# Patient Record
Sex: Female | Born: 1965 | Race: Black or African American | Hispanic: No | Marital: Single | Smoking: Never smoker
Health system: Southern US, Community
[De-identification: ages and names within clinical notes are randomized; demographics above are authoritative.]

## PROBLEM LIST (undated history)

## (undated) DIAGNOSIS — F329 Major depressive disorder, single episode, unspecified: Secondary | ICD-10-CM

## (undated) DIAGNOSIS — F32A Depression, unspecified: Secondary | ICD-10-CM

## (undated) DIAGNOSIS — I10 Essential (primary) hypertension: Secondary | ICD-10-CM

## (undated) DIAGNOSIS — I219 Acute myocardial infarction, unspecified: Secondary | ICD-10-CM

## (undated) DIAGNOSIS — G4730 Sleep apnea, unspecified: Secondary | ICD-10-CM

## (undated) DIAGNOSIS — K219 Gastro-esophageal reflux disease without esophagitis: Secondary | ICD-10-CM

## (undated) HISTORY — PX: CHOLECYSTECTOMY: SHX55

---

## 2012-06-09 ENCOUNTER — Emergency Department (HOSPITAL_COMMUNITY): Payer: Self-pay

## 2012-06-09 ENCOUNTER — Encounter (HOSPITAL_COMMUNITY): Payer: Self-pay | Admitting: Emergency Medicine

## 2012-06-09 ENCOUNTER — Observation Stay (HOSPITAL_COMMUNITY)
Admission: EM | Admit: 2012-06-09 | Discharge: 2012-06-10 | Disposition: A | Discharge status: Home / Self Care | Payer: Self-pay | Attending: Emergency Medicine | Admitting: Emergency Medicine

## 2012-06-09 ENCOUNTER — Other Ambulatory Visit: Payer: Self-pay

## 2012-06-09 DIAGNOSIS — I10 Essential (primary) hypertension: Secondary | ICD-10-CM | POA: Insufficient documentation

## 2012-06-09 DIAGNOSIS — R079 Chest pain, unspecified: Principal | ICD-10-CM | POA: Insufficient documentation

## 2012-06-09 DIAGNOSIS — K219 Gastro-esophageal reflux disease without esophagitis: Secondary | ICD-10-CM | POA: Insufficient documentation

## 2012-06-09 DIAGNOSIS — R1310 Dysphagia, unspecified: Secondary | ICD-10-CM | POA: Insufficient documentation

## 2012-06-09 DIAGNOSIS — R0602 Shortness of breath: Secondary | ICD-10-CM | POA: Insufficient documentation

## 2012-06-09 DIAGNOSIS — K047 Periapical abscess without sinus: Secondary | ICD-10-CM | POA: Insufficient documentation

## 2012-06-09 DIAGNOSIS — Z792 Long term (current) use of antibiotics: Secondary | ICD-10-CM | POA: Insufficient documentation

## 2012-06-09 DIAGNOSIS — R110 Nausea: Secondary | ICD-10-CM | POA: Insufficient documentation

## 2012-06-09 DIAGNOSIS — M7989 Other specified soft tissue disorders: Secondary | ICD-10-CM | POA: Insufficient documentation

## 2012-06-09 DIAGNOSIS — R42 Dizziness and giddiness: Secondary | ICD-10-CM | POA: Insufficient documentation

## 2012-06-09 DIAGNOSIS — M542 Cervicalgia: Secondary | ICD-10-CM | POA: Insufficient documentation

## 2012-06-09 HISTORY — DX: Essential (primary) hypertension: I10

## 2012-06-09 HISTORY — DX: Gastro-esophageal reflux disease without esophagitis: K21.9

## 2012-06-09 LAB — URINALYSIS, ROUTINE W REFLEX MICROSCOPIC
Bilirubin Urine: NEGATIVE
Glucose, UA: NEGATIVE mg/dL
Hgb urine dipstick: NEGATIVE
Ketones, ur: NEGATIVE mg/dL
Nitrite: NEGATIVE
Protein, ur: 30 mg/dL — AB
Specific Gravity, Urine: 1.023 (ref 1.005–1.030)
Urobilinogen, UA: 0.2 mg/dL (ref 0.0–1.0)
pH: 6 (ref 5.0–8.0)

## 2012-06-09 LAB — COMPREHENSIVE METABOLIC PANEL WITH GFR
ALT: 14 U/L (ref 0–35)
Albumin: 4.2 g/dL (ref 3.5–5.2)
Alkaline Phosphatase: 93 U/L (ref 39–117)
BUN: 10 mg/dL (ref 6–23)
Chloride: 105 meq/L (ref 96–112)
Potassium: 3.6 meq/L (ref 3.5–5.1)
Sodium: 140 meq/L (ref 135–145)
Total Bilirubin: 0.3 mg/dL (ref 0.3–1.2)
Total Protein: 7.7 g/dL (ref 6.0–8.3)

## 2012-06-09 LAB — COMPREHENSIVE METABOLIC PANEL
AST: 18 U/L (ref 0–37)
CO2: 25 mEq/L (ref 19–32)
Calcium: 10 mg/dL (ref 8.4–10.5)
Creatinine, Ser: 0.75 mg/dL (ref 0.50–1.10)
GFR calc Af Amer: >90 mL/min (ref >90)
GFR calc non Af Amer: >90 mL/min (ref >90)
Glucose, Bld: 97 mg/dL (ref 70–99)

## 2012-06-09 LAB — CBC WITH DIFFERENTIAL/PLATELET
Basophils Absolute: 0 10*3/uL (ref 0.0–0.1)
Basophils Relative: 1 % (ref 0–1)
Eosinophils Absolute: 0.1 K/uL (ref 0.0–0.7)
Eosinophils Relative: 1 % (ref 0–5)
HCT: 39 % (ref 36.0–46.0)
Hemoglobin: 12.8 g/dL (ref 12.0–15.0)
Lymphocytes Relative: 35 % (ref 12–46)
Lymphs Abs: 2.6 K/uL (ref 0.7–4.0)
MCH: 27.2 pg (ref 26.0–34.0)
MCHC: 32.8 g/dL (ref 30.0–36.0)
MCV: 83 fL (ref 78.0–100.0)
Monocytes Absolute: 0.5 10*3/uL (ref 0.1–1.0)
Monocytes Relative: 7 % (ref 3–12)
Neutro Abs: 4.2 K/uL (ref 1.7–7.7)
Neutrophils Relative %: 57 % (ref 43–77)
Platelets: 280 K/uL (ref 150–400)
RBC: 4.7 MIL/uL (ref 3.87–5.11)
RDW: 12.7 % (ref 11.5–15.5)
WBC: 7.4 10*3/uL (ref 4.0–10.5)

## 2012-06-09 LAB — POCT I-STAT TROPONIN I
Troponin i, poc: 0 ng/mL (ref 0.00–0.08)
Troponin i, poc: 0 ng/mL (ref 0.00–0.08)

## 2012-06-09 LAB — TROPONIN I: Troponin I: <0.3 ng/mL (ref <0.30)

## 2012-06-09 LAB — URINE MICROSCOPIC-ADD ON

## 2012-06-09 MED ORDER — ZOLPIDEM TARTRATE 5 MG PO TABS
5.0000 mg | ORAL_TABLET | Freq: Every evening | ORAL | Status: DC | PRN
  Administered 2012-06-09: 5 mg via ORAL
  Filled 2012-06-09: qty 1

## 2012-06-09 MED ORDER — IOHEXOL 350 MG/ML SOLN
100.0000 mL | Freq: Once | INTRAVENOUS | Status: AC | PRN
  Administered 2012-06-09: 100 mL via INTRAVENOUS

## 2012-06-09 MED ORDER — ASPIRIN 81 MG PO CHEW
324.0000 mg | CHEWABLE_TABLET | Freq: Once | ORAL | Status: AC
  Administered 2012-06-09: 324 mg via ORAL
  Filled 2012-06-09: qty 4

## 2012-06-09 MED ORDER — MORPHINE SULFATE 10 MG/ML IJ SOLN
2.0000 mg | INTRAMUSCULAR | Status: DC | PRN
  Administered 2012-06-09: 2 mg via INTRAVENOUS
  Filled 2012-06-09: qty 1

## 2012-06-09 MED ORDER — KETOROLAC TROMETHAMINE 30 MG/ML IJ SOLN
30.0000 mg | Freq: Once | INTRAMUSCULAR | Status: AC
  Administered 2012-06-09: 30 mg via INTRAVENOUS
  Filled 2012-06-09: qty 1

## 2012-06-09 MED ORDER — GI COCKTAIL ~~LOC~~: 30.0000 mL | Freq: Four times a day (QID) | ORAL | Status: DC | PRN

## 2012-06-09 MED ORDER — ONDANSETRON HCL 4 MG/2ML IJ SOLN
4.0000 mg | Freq: Four times a day (QID) | INTRAMUSCULAR | Status: DC | PRN
Start: 2012-06-09 — End: 2012-06-10

## 2012-06-09 MED ORDER — PENICILLIN V POTASSIUM 250 MG PO TABS
500.0000 mg | ORAL_TABLET | Freq: Once | ORAL | Status: AC
  Administered 2012-06-09: 500 mg via ORAL
  Filled 2012-06-09: qty 2

## 2012-06-09 MED ORDER — ACETAMINOPHEN 325 MG PO TABS: 650.0000 mg | ORAL_TABLET | ORAL | Status: DC | PRN

## 2012-06-09 NOTE — ED Notes (Signed)
States pain in her throat (points to jugular notch area) that radiates thru to her back between her shoulders. States feels like "you want someone to stand on your back", denies belching. States takes a gas pill every day because of a lot of gas

## 2012-06-09 NOTE — ED Notes (Signed)
Cp x 2 weeks just moved from [place] x 1 month some n/v/sob she states

## 2012-06-09 NOTE — ED Notes (Signed)
Pain in back and whole left side pain comes and goes

## 2012-06-09 NOTE — ED Notes (Signed)
Pt reports she has pain that starts in her left neck that radiates down to the top of her chest then into her back for 2 weeks.

## 2012-06-09 NOTE — ED Notes (Signed)
Offered a blanket  Did not want one

## 2012-06-09 NOTE — ED Provider Notes (Signed)
History     CSN: [id number]  Arrival date & time 06/09/12  1332   First MD Initiated Contact with Patient 06/09/12 1654      Chief Complaint  Patient presents with  . Chest Pain    (Consider location/radiation/quality/duration/timing/severity/associated sxs/prior treatment) Patient is a 46 y.o. Hill presenting with chest pain. The history is provided by the patient.  Chest Pain The chest pain began 1 - 2 weeks ago. Chest pain occurs intermittently. The chest pain is unchanged. The pain is associated with exertion. At its most intense, the pain is at 8/10. The pain is currently at 4/10. The quality of the pain is described as pressure-like. The pain radiates to the left neck and left shoulder. Chest pain is worsened by exertion. Primary symptoms include shortness of breath, nausea and dizziness. Pertinent negatives for primary symptoms include no fever, no fatigue, no cough, no palpitations, no abdominal pain and no vomiting.  Dizziness also occurs with nausea. Dizziness does not occur with vomiting or weakness.  Pertinent negatives for associated symptoms include no weakness.   Pt reports left sided chest pain on and off for several weeks, associated with SOB, dizziness, nausea. States takes tylenol, aspirin for it, which makes it better. Sometimes rest makes it better as well.  States pain worsened with walking, exertion, cold air. Denies cough, URI symptoms. Admits to LE swelling, elevated BP. Pt is not on any medications. Just moved up here from [place], about a month ago, no PCP here. Pt also states she feels like there is a "knot" in her left throat, feels it when swallowing. No pain. No prior cardiac work up. No family hx of MIs.   Past Medical History  Diagnosis Date  . Acid reflux   . Hypertension     Past Surgical History  Procedure Date  . Cholecystectomy     No family history on file.  History  Substance Use Topics  . Smoking status: Never Smoker   . Smokeless tobacco:  Not on file  . Alcohol Use: No    OB History    Grav Para Term Preterm Abortions TAB SAB Ect Mult Living                  Review of Systems  Constitutional: Negative for fever, chills and fatigue.  HENT: Negative for neck pain and neck stiffness.   Eyes: Negative for pain and visual disturbance.  Respiratory: Positive for chest tightness and shortness of breath. Negative for cough.   Cardiovascular: Positive for chest pain and leg swelling. Negative for palpitations.  Gastrointestinal: Positive for nausea. Negative for vomiting and abdominal pain.  Genitourinary: Negative for dysuria.  Musculoskeletal: Negative.   Skin: Negative.   Neurological: Positive for dizziness and light-headedness. Negative for weakness and headaches.    Allergies  Review of patient's allergies indicates no known allergies.  Home Medications   Current Outpatient Rx  Name Route Sig Dispense Refill  . TENSION HEADACHE PO Oral Take 1 tablet by mouth daily. Tension headache relief  Acetaminophen 500mg  caffeine 65mg     . AMOXICILLIN 500 MG PO CAPS Oral Take 500 mg by mouth daily as needed. For tooth pain and drainage    . ASPIRIN EC 81 MG PO TBEC Oral Take 81 mg by mouth daily.    Marland Kitchen DIPHENHYDRAMINE-APAP (SLEEP) 25-500 MG PO TABS Oral Take 2 tablets by mouth at bedtime.    Marland Kitchen LOPERAMIDE HCL 2 MG PO TABS Oral Take 2 mg by mouth daily. Anti-diarrheal    .  OVER THE COUNTER MEDICATION Oral Take 1 tablet by mouth daily. Acetaminophen 500mg  and caffeine 32.5mg       BP 162/97  Pulse 60  Temp 98.5 F (36.9 C) (Oral)  Resp 20  SpO2 100%  Physical Exam  Nursing note and vitals reviewed. Constitutional: She is oriented to person, place, and time. She appears well-developed and well-nourished. No distress.       obese  HENT:  Head: Normocephalic and atraumatic.  Right Ear: External ear normal.  Left Ear: External ear normal.  Nose: Nose normal.  Mouth/Throat: Oropharynx is clear and moist.       Abscess to  the right lower 2nd bicuspid, tender to palpation  Eyes: Conjunctivae are normal. Pupils are equal, round, and reactive to light.  Neck: Normal range of motion. Neck supple.       i could not feel a mass, howevet pt feels like there is a swelling and points to the left side of the thyroid  Cardiovascular: Normal rate, regular rhythm and normal heart sounds.   Pulmonary/Chest: Effort normal and breath sounds normal. No respiratory distress. She has no wheezes. She has no rales.  Abdominal: Soft. Bowel sounds are normal. She exhibits no distension. There is no tenderness.  Musculoskeletal:       Trace LE pitting edema bilaterally  Lymphadenopathy:    She has no cervical adenopathy.  Neurological: She is alert and oriented to person, place, and time.  Skin: Skin is warm and dry.  Psychiatric: She has a normal mood and affect.    ED Course  Procedures (including critical care time)  Pt with chest tightness, no prior work up. Hypertensive. Will get labs, ecg, cxr.   Results for orders placed during the hospital encounter of 06/09/12  TROPONIN I      Component Value Range   Troponin I <0.30  <0.30 ng/mL  CBC WITH DIFFERENTIAL      Component Value Range   WBC 7.4  4.0 - 10.5 K/uL   RBC 4.Jasmine  3.87 - 5.11 MIL/uL   Hemoglobin 12.8  12.0 - 15.0 g/dL   HCT 40.9  81.1 - 91.4 %   MCV 83.0  78.0 - 100.0 fL   MCH 27.2  26.0 - 34.0 pg   MCHC 32.8  30.0 - 36.0 g/dL   RDW 78.2  95.6 - 21.3 %   Platelets 280  150 - 400 K/uL   Neutrophils Relative 57  43 - 77 %   Neutro Abs 4.2  1.7 - 7.7 K/uL   Lymphocytes Relative 35  12 - 46 %   Lymphs Abs 2.6  0.7 - 4.0 K/uL   Monocytes Relative 7  3 - 12 %   Monocytes Absolute 0.5  0.1 - 1.0 K/uL   Eosinophils Relative 1  0 - 5 %   Eosinophils Absolute 0.1  0.0 - 0.7 K/uL   Basophils Relative 1  0 - 1 %   Basophils Absolute 0.0  0.0 - 0.1 K/uL  COMPREHENSIVE METABOLIC PANEL      Component Value Range   Sodium 140  135 - 145 mEq/L   Potassium 3.6  3.5  - 5.1 mEq/L   Chloride 105  96 - 112 mEq/L   CO2 25  19 - 32 mEq/L   Glucose, Bld 97  Jasmine - 99 mg/dL   BUN 10  6 - 23 mg/dL   Creatinine, Ser 0.86  0.50 - 1.10 mg/dL   Calcium 57.8  8.4 - 46.9  mg/dL   Total Protein 7.7  6.0 - 8.3 g/dL   Albumin 4.2  3.5 - 5.2 g/dL   AST 18  0 - 37 U/L   ALT 14  0 - 35 U/L   Alkaline Phosphatase 93  39 - 117 U/L   Total Bilirubin 0.3  0.3 - 1.2 mg/dL   GFR calc non Af Amer >90  >90 mL/min   GFR calc Af Amer >90  >90 mL/min  URINALYSIS, ROUTINE W REFLEX MICROSCOPIC      Component Value Range   Color, Urine YELLOW  YELLOW   APPearance CLOUDY (*) CLEAR   Specific Gravity, Urine 1.023  1.005 - 1.030   pH 6.0  5.0 - 8.0   Glucose, UA NEGATIVE  NEGATIVE mg/dL   Hgb urine dipstick NEGATIVE  NEGATIVE   Bilirubin Urine NEGATIVE  NEGATIVE   Ketones, ur NEGATIVE  NEGATIVE mg/dL   Protein, ur 30 (*) NEGATIVE mg/dL   Urobilinogen, UA 0.2  0.0 - 1.0 mg/dL   Nitrite NEGATIVE  NEGATIVE   Leukocytes, UA SMALL (*) NEGATIVE  URINE MICROSCOPIC-ADD ON      Component Value Range   Squamous Epithelial / LPF MANY (*) RARE   WBC, UA 3-6  <3 WBC/hpf   Bacteria, UA RARE  RARE   Casts HYALINE CASTS (*) NEGATIVE   Crystals CA OXALATE CRYSTALS (*) NEGATIVE   Urine-Other MUCOUS PRESENT    POCT I-STAT TROPONIN I      Component Value Range   Troponin i, poc 0.00  0.00 - 0.08 ng/mL   Comment 3           POCT I-STAT TROPONIN I      Component Value Range   Troponin i, poc 0.00  0.00 - 0.08 ng/mL   Comment 3            Dg Chest 2 View  06/09/2012  *RADIOLOGY REPORT*  Clinical Data: Chest pain, difficulty swallowing  CHEST - 2 VIEW  Comparison: None.  Findings: Cardiomediastinal silhouette is unremarkable.  No acute infiltrate or pleural effusion.  No pulmonary edema.  Mild degenerative changes thoracic spine.  IMPRESSION: No active disease.  Mild degenerative changes thoracic spine.  Original Report Authenticated By: Natasha Mead, M.D.   Ct Soft Tissue Neck W  Contrast  06/09/2012  *RADIOLOGY REPORT*  Clinical Data: Neck pain., chest pain, possible left neck mass.  CT NECK WITH CONTRAST  Technique:  Multidetector CT imaging of the neck was performed with intravenous contrast.  Contrast: OMNIPAQUE IOHEXOL 350 MG/ML SOLN  Comparison: None.  Findings: Suprahyoid neck:  Negative.  Larynx:  Negative.  Infrahyoid neck:  Negative.  Lymph nodes:  No pathologic lymphadenopathy.  Upper chest/mediastinum:  See chest CT report.  Additional:  Abundant subcutaneous adipose tissue.  No mass or inflammatory process.  No osseous findings.  Mild spondylosis.  Craniocervical vasculature patent.  Vessels are not sufficiently opacified however to exclude ICA dissection.  IMPRESSION: Unremarkable CT neck with contrast.  No neck mass or inflammatory process is seen.  Original Report Authenticated By: Elsie Stain, M.D.   Ct Angio Chest W/cm &/or Wo Cm  06/09/2012  *RADIOLOGY REPORT*  Clinical Data: Chest pain for 2 weeks.  CT ANGIOGRAPHY CHEST  Technique:  Multidetector CT imaging of the chest using the standard protocol during bolus administration of intravenous contrast. Multiplanar reconstructed images including MIPs were obtained and reviewed to evaluate the vascular anatomy.  Contrast: OMNIPAQUE IOHEXOL 350 MG/ML SOLN  Comparison:  None.  Findings: Cardiomegaly is present.  No coronary artery calcification is seen.  There is no central pulmonary embolus. Detection of peripheral PE hampered somewhat by body habitus.  No pulmonary infiltrates.  No hilar or mediastinal adenopathy. Evidence for prior granulomatous disease.  No pleural or pericardial effusion.  No lung nodules.  Degenerative changes are present in the thoracic spine with multilevel anterior spurring. Hepatic steatosis.  Prior cholecystectomy.  IMPRESSION: Cardiomegaly.  No central pulmonary embolus.  No infiltrate or failure.  Original Report Authenticated By: Elsie Stain, M.D.     Date: 06/10/2012   Rate: Jasmine  Rhythm: normal sinus rhythm  QRS Axis: normal  Intervals: normal  ST/T Wave abnormalities: normal  Conduction Disutrbances: none  Narrative Interpretation:   Old EKG Reviewed:No old   Pt complaining of mass like sensation to left neck, CT negative. CT angio negative. Enzymes negative. Pt chest pain free at present. Will place on CP protocol with stress in am. Pt also has a dental abscess. First dose of penicillin given tonight. Will need antibiotics upon discharge.     No diagnosis found.    MDM          Lottie Mussel, PA 06/10/12 [phone]

## 2012-06-10 DIAGNOSIS — R072 Precordial pain: Secondary | ICD-10-CM

## 2012-06-10 LAB — POCT I-STAT TROPONIN I: Troponin i, poc: 0 ng/mL (ref 0.00–0.08)

## 2012-06-10 MED ORDER — OMEPRAZOLE 20 MG PO CPDR: 20.0000 mg | DELAYED_RELEASE_CAPSULE | Freq: Every day | ORAL | Status: DC

## 2012-06-10 MED ORDER — PENICILLIN V POTASSIUM 500 MG PO TABS: 500.0000 mg | ORAL_TABLET | Freq: Three times a day (TID) | ORAL | Status: AC

## 2012-06-10 NOTE — Progress Notes (Signed)
Observation review is complete. 

## 2012-06-10 NOTE — ED Notes (Signed)
Called vascular lab to check on delay in results. Per billy dr Jens Som is aware of study

## 2012-06-10 NOTE — ED Provider Notes (Signed)
Medical screening examination/treatment/procedure(s) were performed by non-physician practitioner and as supervising physician I was immediately available for consultation/collaboration.   Russie Gulledge M Jarelle Ates, MD 06/10/12 1708 

## 2012-06-10 NOTE — ED Provider Notes (Signed)
Jasmine Hill is a 46 y.o. female who has been in the CDU overnight on the chest pain protocol.  Her labs, CTA and Echo Stress have all resulted as normal and the chest pain work up remains negative.    She was hypertensive on arrival at 183/108 but had a spontaneous decrease to 158/91.  She reports a history of HTN without treatment.  She does not have a PCP here in [place]. Her CP symptoms are reported to be worse in the early morning hours and when lying flat therefore I will attempt to treat as reflux symptoms by starting Prilosec.  We will discharge today with a planned follow up with a primary care provider to address the chest pain and HTN.  I will not start oral antihypertensives today as her BP has decreased without intervention.  She has been provided resources for obtaining this appointment.  She is also c/o a dental abscess in the R low aspect between the canine and 1st molar as denoted below.  She has been self treating with an old Amoxicillin Rx.  I will treat this with a PCN Rx and have her follow up with dentistry.    Periodontal Exam      Dierdre Forth, PA-C 06/10/12 1247

## 2012-06-10 NOTE — Progress Notes (Signed)
  Echocardiogram Echocardiogram Stress Test has been performed.  Jasmine Hill 06/10/2012, 9:23 AM

## 2012-06-13 NOTE — ED Provider Notes (Signed)
Medical screening examination/treatment/procedure(s) were conducted as a shared visit with non-physician practitioner(s) and myself.  I personally evaluated the patient during the encounter  Pt with risk factors for CAD, obesity, HTN.  CT shows no PE, PTX.  Would recommend CDU obs and stress test.  No active CP at the moment, RA sats are normal.  Troponin is neg initially.    Gavin Pound. Dary Dilauro, MD 06/13/12 1191

## 2015-11-26 ENCOUNTER — Emergency Department (INDEPENDENT_AMBULATORY_CARE_PROVIDER_SITE_OTHER)
Admission: EM | Admit: 2015-11-26 | Discharge: 2015-11-26 | Disposition: A | Discharge status: Home / Self Care | Payer: BLUE CROSS/BLUE SHIELD | Source: Home / Self Care | Attending: Emergency Medicine | Admitting: Emergency Medicine

## 2015-11-26 ENCOUNTER — Encounter (HOSPITAL_COMMUNITY): Payer: Self-pay | Admitting: Emergency Medicine

## 2015-11-26 DIAGNOSIS — K029 Dental caries, unspecified: Secondary | ICD-10-CM | POA: Diagnosis not present

## 2015-11-26 DIAGNOSIS — I159 Secondary hypertension, unspecified: Secondary | ICD-10-CM | POA: Diagnosis not present

## 2015-11-26 MED ORDER — HYDROCODONE-ACETAMINOPHEN 5-325 MG PO TABS: 1.0000 | ORAL_TABLET | ORAL | Status: DC | PRN

## 2015-11-26 MED ORDER — AMOXICILLIN 500 MG PO CAPS: 500.0000 mg | ORAL_CAPSULE | Freq: Three times a day (TID) | ORAL | Status: DC

## 2015-11-26 MED ORDER — AMLODIPINE BESYLATE 10 MG PO TABS: 10.0000 mg | ORAL_TABLET | Freq: Every day | ORAL | Status: DC

## 2015-11-26 NOTE — ED Notes (Signed)
Pt c/o HTN associated w/HA onset 2 weeks... Believes it's due to dental pain on lower right side Has appt w/dentist on 12/09/15 A&O x4... No acute distress.

## 2015-11-26 NOTE — Discharge Instructions (Signed)
Dental Caries Dental caries is tooth decay. This decay can cause a hole in teeth (cavity) that can get bigger and deeper over time. HOME CARE  Brush and floss your teeth. Do this at least two times a day.  Use a fluoride toothpaste.  Use a mouth rinse if told by your dentist or doctor.  Eat less sugary and starchy foods. Drink less sugary drinks.  Avoid snacking often on sugary and starchy foods. Avoid sipping often on sugary drinks.  Keep regular checkups and cleanings with your dentist.  Use fluoride supplements if told by your dentist or doctor.  Allow fluoride to be applied to teeth if told by your dentist or doctor.   This information is not intended to replace advice given to you by your health care provider. Make sure you discuss any questions you have with your health care provider.   Document Released: 08/11/2008 Document Revised: 11/23/2014 Document Reviewed: 11/04/2012 Elsevier Interactive Patient Education 2016 [employer] Hypertension Hypertension, commonly called high blood pressure, is when the force of blood pumping through your arteries is too strong. Your arteries are the blood vessels that carry blood from your heart throughout your body. A blood pressure reading consists of a higher number over a lower number, such as 110/72. The higher number (systolic) is the pressure inside your arteries when your heart pumps. The lower number (diastolic) is the pressure inside your arteries when your heart relaxes. Ideally you want your blood pressure below 120/80. Hypertension forces your heart to work harder to pump blood. Your arteries may become narrow or stiff. Having untreated or uncontrolled hypertension can cause heart attack, stroke, kidney disease, and other problems. RISK FACTORS Some risk factors for high blood pressure are controllable. Others are not.  Risk factors you cannot control include:   Race. You may be at higher risk if you are African American.  Age.  Risk increases with age.  Gender. Men are at higher risk than women before age 52 years. After age 52, women are at higher risk than men. Risk factors you can control include:  Not getting enough exercise or physical activity.  Being overweight.  Getting too much fat, sugar, calories, or salt in your diet.  Drinking too much alcohol. SIGNS AND SYMPTOMS Hypertension does not usually cause signs or symptoms. Extremely high blood pressure (hypertensive crisis) may cause headache, anxiety, shortness of breath, and nosebleed. DIAGNOSIS To check if you have hypertension, your health care provider will measure your blood pressure while you are seated, with your arm held at the level of your heart. It should be measured at least twice using the same arm. Certain conditions can cause a difference in blood pressure between your right and left arms. A blood pressure reading that is higher than normal on one occasion does not mean that you need treatment. If it is not clear whether you have high blood pressure, you may be asked to return on a different day to have your blood pressure checked again. Or, you may be asked to monitor your blood pressure at home for 1 or more weeks. TREATMENT Treating high blood pressure includes making lifestyle changes and possibly taking medicine. Living a healthy lifestyle can help lower high blood pressure. You may need to change some of your habits. Lifestyle changes may include:  Following the DASH diet. This diet is high in fruits, vegetables, and whole grains. It is low in salt, red meat, and added sugars.  Keep your sodium intake below 2,300 mg  per day.  Getting at least 30-45 minutes of aerobic exercise at least 4 times per week.  Losing weight if necessary.  Not smoking.  Limiting alcoholic beverages.  Learning ways to reduce stress. Your health care provider may prescribe medicine if lifestyle changes are not enough to get your blood pressure under  control, and if one of the following is true:  You are 107-90 years of age and your systolic blood pressure is above 140.  You are 55 years of age or older, and your systolic blood pressure is above 150.  Your diastolic blood pressure is above 90.  You have diabetes, and your systolic blood pressure is over 140 or your diastolic blood pressure is over 90.  You have kidney disease and your blood pressure is above 140/90.  You have heart disease and your blood pressure is above 140/90. Your personal target blood pressure may vary depending on your medical conditions, your age, and other factors. HOME CARE INSTRUCTIONS  Have your blood pressure rechecked as directed by your health care provider.   Take medicines only as directed by your health care provider. Follow the directions carefully. Blood pressure medicines must be taken as prescribed. The medicine does not work as well when you skip doses. Skipping doses also puts you at risk for problems.  Do not smoke.   Monitor your blood pressure at home as directed by your health care provider. SEEK MEDICAL CARE IF:   You think you are having a reaction to medicines taken.  You have recurrent headaches or feel dizzy.  You have swelling in your ankles.  You have trouble with your vision. SEEK IMMEDIATE MEDICAL CARE IF:  You develop a severe headache or confusion.  You have unusual weakness, numbness, or feel faint.  You have severe chest or abdominal pain.  You vomit repeatedly.  You have trouble breathing. MAKE SURE YOU:   Understand these instructions.  Will watch your condition.  Will get help right away if you are not doing well or get worse.   This information is not intended to replace advice given to you by your health care provider. Make sure you discuss any questions you have with your health care provider.   Document Released: 11/02/2005 Document Revised: 03/19/2015 Document Reviewed: 08/25/2013 Elsevier  Interactive Patient Education [employer]

## 2015-11-26 NOTE — ED Provider Notes (Signed)
CSN: [id number]     Arrival date & time 11/26/15  1835 History   First MD Initiated Contact with Patient 11/26/15 1958     Chief Complaint  Patient presents with  . Hypertension  . Dental Pain   (Consider location/radiation/quality/duration/timing/severity/associated sxs/prior Treatment) HPI History obtained from patient:   LOCATION: right lower tooth SEVERITY:6 DURATION:1 week CONTEXT:sudden onset of pain, decayed tooth for quite some time QUALITY:ache MODIFYING FACTORS: has tried OTC meds, was able to make dental appointment ASSOCIATED SYMPTOMS:jaw pain, not sleeping TIMING:constant Has not taken her medication for about 6 months.   Past Medical History  Diagnosis Date  . Acid reflux   . Hypertension    Past Surgical History  Procedure Laterality Date  . Cholecystectomy     No family history on file. Social History  Substance Use Topics  . Smoking status: Never Smoker   . Smokeless tobacco: None  . Alcohol Use: No   OB History    No data available     Review of Systems ROS +'ve dental pain  Denies: HEADACHE, NAUSEA, ABDOMINAL PAIN, CHEST PAIN, CONGESTION, DYSURIA, SHORTNESS OF BREATH  Allergies  Review of patient's allergies indicates no known allergies.  Home Medications   Prior to Admission medications   Medication Sig Start Date End Date Taking? Authorizing Provider  Acetaminophen-Caffeine (TENSION HEADACHE PO) Take 1 tablet by mouth daily. Tension headache relief  Acetaminophen 500mg  caffeine 65mg     Historical Provider, MD  amLODipine (NORVASC) 10 MG tablet Take 1 tablet (10 mg total) by mouth daily. 11/26/15   Tharon AquasFrank C Ruven Corradi, PA  amoxicillin (AMOXIL) 500 MG capsule Take 1 capsule (500 mg total) by mouth 3 (three) times daily. 11/26/15   Tharon AquasFrank C Sheridan Gettel, PA  aspirin EC 81 MG tablet Take 81 mg by mouth daily.    Historical Provider, MD  diphenhydramine-acetaminophen (TYLENOL PM) 25-500 MG TABS Take 2 tablets by mouth at bedtime.    Historical Provider,  MD  HYDROcodone-acetaminophen (NORCO/VICODIN) 5-325 MG tablet Take 1 tablet by mouth every 4 (four) hours as needed. 11/26/15   Tharon AquasFrank C Solomiya Pascale, PA  loperamide (IMODIUM A-D) 2 MG tablet Take 2 mg by mouth daily. Anti-diarrheal    Historical Provider, MD  omeprazole (PRILOSEC) 20 MG capsule Take 1 capsule (20 mg total) by mouth daily. 06/10/12 06/10/13  Elpidio AnisShari Upstill, PA-C  OVER THE COUNTER MEDICATION Take 1 tablet by mouth daily. Acetaminophen 500mg  and caffeine 32.5mg     Historical Provider, MD   Meds Ordered and Administered this Visit  Medications - No data to display  BP 210/116 mmHg  Pulse 86  Temp(Src) 98.2 F (36.8 C) (Oral)  Resp 18  SpO2 99% No data found.   Physical Exam  Constitutional: She appears well-developed and well-nourished.  HENT:  Head: Normocephalic and atraumatic.  Mouth/Throat:    Pulmonary/Chest: Effort normal.  Nursing note and vitals reviewed.   ED Course  Procedures (including critical care time)  Labs Review Labs Reviewed - No data to display  Imaging Review No results found.   Visual Acuity Review  Right Eye Distance:   Left Eye Distance:   Bilateral Distance:    Right Eye Near:   Left Eye Near:    Bilateral Near:         MDM   1. Secondary hypertension, unspecified   2. Dental caries    Elevated blood pressure has been discussed with patient. She has stated she came for dental pain.  She is will to take prescription for  amlodipine, and will take referral number.  Has appointment with dentist next week.     Tharon Aquas, PA 11/26/15 2026

## 2016-05-18 ENCOUNTER — Other Ambulatory Visit: Payer: Self-pay | Admitting: Family

## 2016-05-18 DIAGNOSIS — R51 Headache: Principal | ICD-10-CM

## 2016-05-18 DIAGNOSIS — R519 Headache, unspecified: Secondary | ICD-10-CM

## 2016-05-28 ENCOUNTER — Ambulatory Visit
Admission: RE | Admit: 2016-05-28 | Discharge: 2016-05-28 | Disposition: A | Discharge status: Home / Self Care | Payer: BLUE CROSS/BLUE SHIELD | Source: Ambulatory Visit | Attending: Family | Admitting: Family

## 2016-05-28 DIAGNOSIS — R519 Headache, unspecified: Secondary | ICD-10-CM

## 2016-05-28 DIAGNOSIS — R51 Headache: Principal | ICD-10-CM

## 2016-06-26 ENCOUNTER — Other Ambulatory Visit: Payer: Self-pay

## 2016-06-26 ENCOUNTER — Other Ambulatory Visit (HOSPITAL_COMMUNITY)
Admission: RE | Admit: 2016-06-26 | Discharge: 2016-06-26 | Disposition: A | Discharge status: Home / Self Care | Payer: BLUE CROSS/BLUE SHIELD | Source: Ambulatory Visit | Attending: Family Medicine | Admitting: Family Medicine

## 2016-06-26 DIAGNOSIS — Z01419 Encounter for gynecological examination (general) (routine) without abnormal findings: Secondary | ICD-10-CM | POA: Diagnosis not present

## 2016-07-01 ENCOUNTER — Other Ambulatory Visit: Payer: Self-pay | Admitting: Family

## 2016-07-01 DIAGNOSIS — Z1231 Encounter for screening mammogram for malignant neoplasm of breast: Secondary | ICD-10-CM

## 2016-07-01 LAB — CYTOLOGY - PAP

## 2016-07-14 ENCOUNTER — Other Ambulatory Visit: Payer: Self-pay | Admitting: Family

## 2016-07-14 ENCOUNTER — Ambulatory Visit
Admission: RE | Admit: 2016-07-14 | Discharge: 2016-07-14 | Disposition: A | Discharge status: Home / Self Care | Payer: BLUE CROSS/BLUE SHIELD | Source: Ambulatory Visit | Attending: Family | Admitting: Family

## 2016-07-14 DIAGNOSIS — Z1231 Encounter for screening mammogram for malignant neoplasm of breast: Secondary | ICD-10-CM

## 2016-09-26 ENCOUNTER — Emergency Department (HOSPITAL_COMMUNITY): Payer: BLUE CROSS/BLUE SHIELD

## 2016-09-26 ENCOUNTER — Encounter (HOSPITAL_COMMUNITY): Payer: Self-pay | Admitting: *Deleted

## 2016-09-26 ENCOUNTER — Emergency Department (HOSPITAL_COMMUNITY)
Admission: EM | Admit: 2016-09-26 | Discharge: 2016-09-27 | Disposition: A | Discharge status: Home / Self Care | Payer: BLUE CROSS/BLUE SHIELD | Attending: Emergency Medicine | Admitting: Emergency Medicine

## 2016-09-26 DIAGNOSIS — Y92410 Unspecified street and highway as the place of occurrence of the external cause: Secondary | ICD-10-CM | POA: Insufficient documentation

## 2016-09-26 DIAGNOSIS — M545 Low back pain: Secondary | ICD-10-CM | POA: Insufficient documentation

## 2016-09-26 DIAGNOSIS — I10 Essential (primary) hypertension: Secondary | ICD-10-CM | POA: Diagnosis not present

## 2016-09-26 DIAGNOSIS — M549 Dorsalgia, unspecified: Secondary | ICD-10-CM | POA: Diagnosis present

## 2016-09-26 DIAGNOSIS — Y999 Unspecified external cause status: Secondary | ICD-10-CM | POA: Insufficient documentation

## 2016-09-26 DIAGNOSIS — Y939 Activity, unspecified: Secondary | ICD-10-CM | POA: Insufficient documentation

## 2016-09-26 DIAGNOSIS — Z79899 Other long term (current) drug therapy: Secondary | ICD-10-CM | POA: Insufficient documentation

## 2016-09-26 DIAGNOSIS — S20229A Contusion of unspecified back wall of thorax, initial encounter: Secondary | ICD-10-CM

## 2016-09-26 MED ORDER — NAPROXEN 250 MG PO TABS
500.0000 mg | ORAL_TABLET | Freq: Once | ORAL | Status: AC
  Administered 2016-09-26: 500 mg via ORAL
  Filled 2016-09-26: qty 2
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
## 2016-09-26 NOTE — ED Notes (Signed)
Patient transported to X-ray 

## 2016-09-26 NOTE — ED Notes (Signed)
ED Provider at bedside. 

## 2016-09-27 LAB — URINALYSIS, ROUTINE W REFLEX MICROSCOPIC
BILIRUBIN URINE: NEGATIVE
GLUCOSE, UA: NEGATIVE mg/dL
HGB URINE DIPSTICK: NEGATIVE
Ketones, ur: NEGATIVE mg/dL
Nitrite: NEGATIVE
PH: 5.5 (ref 5.0–8.0)
Protein, ur: 30 mg/dL — AB
SPECIFIC GRAVITY, URINE: 1.021 (ref 1.005–1.030)

## 2016-09-27 LAB — URINE MICROSCOPIC-ADD ON

## 2016-09-27 MED ORDER — METHOCARBAMOL 500 MG PO TABS: 500.0000 mg | ORAL_TABLET | Freq: Two times a day (BID) | ORAL | 0 refills | Status: DC

## 2016-09-27 MED ORDER — NAPROXEN 500 MG PO TABS: 500.0000 mg | ORAL_TABLET | Freq: Two times a day (BID) | ORAL | 0 refills | Status: DC

## 2016-09-27 NOTE — Discharge Instructions (Signed)
Alternate ice and heat to areas of pain/injury 3-4 times per day for 15-20 minutes each time. Take naproxen as prescribed for pain and Robaxin, as needed, for muscle spasms. Follow-up with your primary care doctor to ensure resolution of symptoms.
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
## 2016-09-28 LAB — URINE CULTURE

## 2016-10-02 ENCOUNTER — Encounter (HOSPITAL_COMMUNITY): Payer: Self-pay | Admitting: *Deleted

## 2016-10-02 NOTE — Progress Notes (Signed)
Called office of Dr Boneta LucksJennifer Brown office Deboraha Sprang( [facility]) and they will need per message a signed medical release form signed by patient in order for us to obtain medical records.

## 2016-10-13 ENCOUNTER — Other Ambulatory Visit: Payer: Self-pay | Admitting: Gastroenterology

## 2016-10-14 ENCOUNTER — Encounter (HOSPITAL_COMMUNITY)
Admission: RE | Disposition: A | Discharge status: Home / Self Care | Payer: Self-pay | Source: Ambulatory Visit | Attending: Gastroenterology

## 2016-10-14 ENCOUNTER — Encounter (HOSPITAL_COMMUNITY): Payer: Self-pay | Admitting: Certified Registered"

## 2016-10-14 ENCOUNTER — Ambulatory Visit (HOSPITAL_COMMUNITY)
Admission: RE | Admit: 2016-10-14 | Discharge: 2016-10-14 | Disposition: A | Discharge status: Home / Self Care | Payer: BLUE CROSS/BLUE SHIELD | Source: Ambulatory Visit | Attending: Gastroenterology | Admitting: Gastroenterology

## 2016-10-14 ENCOUNTER — Ambulatory Visit (HOSPITAL_COMMUNITY): Payer: BLUE CROSS/BLUE SHIELD | Admitting: Certified Registered"

## 2016-10-14 DIAGNOSIS — Z7982 Long term (current) use of aspirin: Secondary | ICD-10-CM | POA: Diagnosis not present

## 2016-10-14 DIAGNOSIS — I10 Essential (primary) hypertension: Secondary | ICD-10-CM | POA: Diagnosis not present

## 2016-10-14 DIAGNOSIS — Z79899 Other long term (current) drug therapy: Secondary | ICD-10-CM | POA: Diagnosis not present

## 2016-10-14 DIAGNOSIS — Z6841 Body Mass Index (BMI) 40.0 and over, adult: Secondary | ICD-10-CM | POA: Insufficient documentation

## 2016-10-14 DIAGNOSIS — Z1211 Encounter for screening for malignant neoplasm of colon: Secondary | ICD-10-CM | POA: Insufficient documentation

## 2016-10-14 HISTORY — DX: Major depressive disorder, single episode, unspecified: F32.9

## 2016-10-14 HISTORY — DX: Sleep apnea, unspecified: G47.30

## 2016-10-14 HISTORY — DX: Acute myocardial infarction, unspecified: I21.9

## 2016-10-14 HISTORY — DX: Depression, unspecified: F32.A

## 2016-10-14 HISTORY — PX: FLEXIBLE SIGMOIDOSCOPY: SHX5431

## 2016-10-14 SURGERY — SIGMOIDOSCOPY, FLEXIBLE
Anesthesia: Monitor Anesthesia Care

## 2016-10-14 MED ORDER — SODIUM CHLORIDE 0.9 % IV SOLN: INTRAVENOUS | Status: DC

## 2016-10-14 MED ORDER — PROPOFOL 500 MG/50ML IV EMUL
INTRAVENOUS | Status: DC | PRN
  Administered 2016-10-14: 75 ug/kg/min via INTRAVENOUS

## 2016-10-14 MED ORDER — LIDOCAINE 2% (20 MG/ML) 5 ML SYRINGE
INTRAMUSCULAR | Status: DC | PRN
  Administered 2016-10-14: 100 mg via INTRAVENOUS

## 2016-10-14 MED ORDER — PROPOFOL 10 MG/ML IV BOLUS
INTRAVENOUS | Status: DC | PRN
Start: 2016-10-14 — End: 2016-10-14
  Administered 2016-10-14: 10 mg via INTRAVENOUS
  Administered 2016-10-14: 20 mg via INTRAVENOUS

## 2016-10-14 MED ORDER — LIDOCAINE 2% (20 MG/ML) 5 ML SYRINGE
INTRAMUSCULAR | Status: AC
  Filled 2016-10-14: qty 5

## 2016-10-14 MED ORDER — LACTATED RINGERS IV SOLN
INTRAVENOUS | Status: DC
  Administered 2016-10-14: 1000 mL via INTRAVENOUS

## 2016-10-14 MED ORDER — PROPOFOL 10 MG/ML IV BOLUS
INTRAVENOUS | Status: AC
  Filled 2016-10-14: qty 40

## 2016-10-14 SURGICAL SUPPLY — 21 items

## 2016-10-14 NOTE — Op Note (Signed)
[facility] Patient Name: Jasmine DriverBeverly Stembridge Procedure Date: 10/14/2016 MRN: [id number] Attending MD: Willis ModenaWilliam Markelle Asaro , MD Date of Birth: 05/21/1966 CSN: [id number] Age: [age] Admit Type: Outpatient Procedure:                Flexible Sigmoidoscopy Indications:              Screening for colorectal malignant neoplasm Providers:                Willis ModenaWilliam Alvie Fowles, MD, Dwain SarnaPatricia Ford, RN, Arlee Muslimhris                            Chandler Tech., Technician, [facility]ord,                            CRNA Referring MD:              Medicines:                Propofol per Anesthesia Complications:            No immediate complications. Estimated Blood Loss:     Estimated blood loss: none. Procedure:                Pre-Anesthesia Assessment:                           - Prior to the procedure, a History and Physical                            was performed, and patient medications and                            allergies were reviewed. The patient's tolerance of                            previous anesthesia was also reviewed. The risks                            and benefits of the procedure and the sedation                            options and risks were discussed with the patient.                            All questions were answered, and informed consent                            was obtained. Prior Anticoagulants: The patient has                            taken no previous anticoagulant or antiplatelet                            agents. ASA Grade Assessment: III - A patient with                            severe  systemic disease. After reviewing the risks                            and benefits, the patient was deemed in                            satisfactory condition to undergo the procedure.                           After obtaining informed consent, the scope was                            passed under direct vision. The scope was advanced                            to the After  obtaining informed consent, the scope                            was passed under direct vision. The flexible                            sigmoidoscopy was accomplished without difficulty.                            The patient tolerated the procedure well. The                            quality of the bowel preparation was poor,                            inadequate and unsatisfactory. The flexible                            sigmoidoscopy was aborted due to inadequate bowel                            prep. Scope In: 8:47:09 AM Scope Out: 8:49:41 AM Total Procedure Duration: 0 hours 2 minutes 32 seconds  Findings:      The perianal and digital rectal examinations were normal.      Solid stool obscured most mucosal views of colon. Inadequate for       screening purposes. Impression:               - Preparation of the colon was poor.                           - Preparation of the colon was unsatisfactory.                           - Preparation of the colon was inadequate.                           - The procedure was aborted due to inadequate bowel  prep.                           - No specimens collected. Moderate Sedation:      N/A- Per Anesthesia Care Recommendation:           - Discharge patient to home (via wheelchair).                           - Resume previous diet today.                           - Continue present medications.                           - Repeat flexible sigmoidoscopy at appointment to                            be scheduled for screening purposes. Procedure Code(s):        --- Professional ---                           [phone], Colonoscopy, flexible; diagnostic, including                            collection of specimen(s) by brushing or washing,                            when performed (separate procedure) Diagnosis Code(s):        --- Professional ---                           Z12.11, Encounter for screening for malignant                             neoplasm of colon                           Z53.8, Procedure and treatment not carried out for                            other reasons CPT copyright 2016 [employer]. All rights reserved. The codes documented in this report are preliminary and upon coder review may  be revised to meet current compliance requirements. Willis Modena, MD 10/14/2016 9:05:08 AM This report has been signed electronically. Number of Addenda: 0

## 2016-10-14 NOTE — CV Procedure (Signed)
Colonoscopy ° °Post procedure instructions: ° °Read the instructions outlined below and refer to this sheet in the next few weeks. These discharge instructions provide you with general information on caring for yourself after you leave the hospital. Your doctor may also give you specific instructions. While your treatment has been planned according to the most current medical practices available, unavoidable complications occasionally occur. If you have any problems or questions after discharge, call Dr. Del Wiseman at [facility] ([phone]). ° °HOME CARE INSTRUCTIONS ° °ACTIVITY: °· You may resume your regular activity, but move at a slower pace for the next 24 hours.  °· Take frequent rest periods for the next 24 hours.  °· Walking will help get rid of the air and reduce the bloated feeling in your belly (abdomen).  °· No driving for 24 hours (because of the medicine (anesthesia) used during the test).  °· You may shower.  °· Do not sign any important legal documents or operate any machinery for 24 hours (because of the anesthesia used during the test).  °NUTRITION: °· Drink plenty of fluids.  °· You may resume your normal diet as instructed by your doctor.  °· Begin with a light meal and progress to your normal diet. Heavy or fried foods are harder to digest and may make you feel sick to your stomach (nauseated).  °· Avoid alcoholic beverages for 24 hours or as instructed.  °MEDICATIONS: °· You may resume your normal medications unless your doctor tells you otherwise.  °WHAT TO EXPECT TODAY: °· Some feelings of bloating in the abdomen.  °· Passage of more gas than usual.  °· Spotting of blood in your stool or on the toilet paper.  °IF YOU HAD POLYPS REMOVED DURING THE COLONOSCOPY: °· No aspirin products for 7 days or as instructed.  °· No alcohol for 7 days or as instructed.  °· Eat a soft diet for the next 24 hours.  ° °FINDING OUT THE RESULTS OF YOUR TEST ° °Not all test results are available during your  visit. If your test results are not back during the visit, make an appointment with your caregiver to find out the results. Do not assume everything is normal if you have not heard from your caregiver or the medical facility. It is important for you to follow up on all of your test results.  ° ° ° °SEEK IMMEDIATE MEDICAL CARE IF: ° °· You have more than a spotting of blood in your stool.  °· Your belly is swollen (abdominal distention).  °· You are nauseated or vomiting.  °· You have a fever.  °· You have abdominal pain or discomfort that is severe or gets worse throughout the day.  ° ° °Document Released: 06/16/2004 Document Revised: 07/15/2011 Document Reviewed: 06/14/2008 °Exit[employer]® Patient Information ©2012 [employer]. ° °

## 2016-10-14 NOTE — Discharge Instructions (Signed)

## 2016-10-14 NOTE — Anesthesia Preprocedure Evaluation (Signed)
Anesthesia Evaluation  Patient identified by MRN, date of birth, ID band Patient awake    Reviewed: Allergy & Precautions, NPO status , Patient's Chart, lab work & pertinent test results  Airway Mallampati: II  TM Distance: >3 FB Neck ROM: Full    Dental no notable dental hx.    Pulmonary sleep apnea ,    Pulmonary exam normal breath sounds clear to auscultation       Cardiovascular hypertension, Pt. on medications Normal cardiovascular exam Rhythm:Regular Rate:Normal     Neuro/Psych negative neurological ROS  negative psych ROS   GI/Hepatic negative GI ROS, Neg liver ROS,   Endo/Other  Morbid obesity  Renal/GU negative Renal ROS  negative genitourinary   Musculoskeletal negative musculoskeletal ROS (+)   Abdominal   Peds negative pediatric ROS (+)  Hematology negative hematology ROS (+)   Anesthesia Other Findings   Reproductive/Obstetrics negative OB ROS                            Anesthesia Physical Anesthesia Plan  ASA: III  Anesthesia Plan: MAC   Post-op Pain Management:    Induction:   Airway Management Planned: Simple Face Mask  Additional Equipment:   Intra-op Plan:   Post-operative Plan:   Informed Consent: I have reviewed the patients History and Physical, chart, labs and discussed the procedure including the risks, benefits and alternatives for the proposed anesthesia with the patient or authorized representative who has indicated his/her understanding and acceptance.   Dental advisory given  Plan Discussed with: CRNA  Anesthesia Plan Comments:         Anesthesia Quick Evaluation

## 2016-10-14 NOTE — H&P (Signed)
Patient interval history reviewed.  Patient examined again.  There has been no change from documented H/P dated 10/13/16 (scanned into chart from our office) except as documented above.  Assessment:  1.  Colon cancer screening, average risk.  Plan:  1.  Colonoscopy. 2.  Risks (bleeding, infection, bowel perforation that could require surgery, sedation-related changes in cardiopulmonary systems), benefits (identification and possible treatment of source of symptoms, exclusion of certain causes of symptoms), and alternatives (watchful waiting, radiographic imaging studies, empiric medical treatment) of colonoscopy were explained to patient/family in detail and patient wishes to proceed.

## 2016-10-14 NOTE — Transfer of Care (Signed)
Immediate Anesthesia Transfer of Care Note  Patient: Jasmine DriverBeverly Hill  Procedure(s) Performed: Procedure(s): FLEXIBLE SIGMOIDOSCOPY (N/A)  Patient Location: PACU  Anesthesia Type:MAC  Level of Consciousness: awake, alert  and oriented  Airway & Oxygen Therapy: Patient Spontanous Breathing and Patient connected to face mask oxygen  Post-op Assessment: Report given to RN and Post -op Vital signs reviewed and stable  Post vital signs: Reviewed and stable  Last Vitals:  Vitals:   10/14/16 0736  BP: (!) 208/97  Pulse: 66  Resp: 11  Temp: 36.7 C    Last Pain:  Vitals:   10/14/16 0736  TempSrc: Oral         Complications: No apparent anesthesia complications

## 2016-10-14 NOTE — Anesthesia Postprocedure Evaluation (Signed)
Anesthesia Post Note  Patient: Jasmine Hill  Procedure(s) Performed: Procedure(s) (LRB): FLEXIBLE SIGMOIDOSCOPY (N/A)  Patient location during evaluation: PACU Anesthesia Type: MAC Level of consciousness: awake and alert Pain management: pain level controlled Vital Signs Assessment: post-procedure vital signs reviewed and stable Respiratory status: spontaneous breathing, nonlabored ventilation, respiratory function stable and patient connected to nasal cannula oxygen Cardiovascular status: stable and blood pressure returned to baseline Anesthetic complications: no    Last Vitals:  Vitals:   10/14/16 0920 10/14/16 0930  BP: (!) 195/115 (!) 189/87  Pulse: 61 64  Resp: 18 14  Temp:      Last Pain:  Vitals:   10/14/16 0859  TempSrc: Oral                 Phillips Groutarignan, Nathaniel Yaden

## 2016-10-15 ENCOUNTER — Encounter (HOSPITAL_COMMUNITY): Payer: Self-pay | Admitting: Gastroenterology
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
## 2022-03-05 ENCOUNTER — Other Ambulatory Visit (HOSPITAL_COMMUNITY): Payer: Self-pay | Admitting: Family Medicine

## 2022-03-05 DIAGNOSIS — Z1231 Encounter for screening mammogram for malignant neoplasm of breast: Secondary | ICD-10-CM

## 2023-01-18 ENCOUNTER — Ambulatory Visit: Payer: BLUE CROSS/BLUE SHIELD | Admitting: Internal Medicine

## 2023-07-23 ENCOUNTER — Other Ambulatory Visit: Payer: Self-pay | Admitting: Internal Medicine

## 2023-07-23 DIAGNOSIS — M79662 Pain in left lower leg: Secondary | ICD-10-CM

## 2023-10-26 ENCOUNTER — Encounter: Payer: Self-pay | Admitting: Internal Medicine

## 2023-10-28 ENCOUNTER — Inpatient Hospital Stay: Admission: RE | Admit: 2023-10-28 | Payer: Self-pay | Source: Ambulatory Visit

## 2023-11-14 ENCOUNTER — Ambulatory Visit
Admission: RE | Admit: 2023-11-14 | Discharge: 2023-11-14 | Disposition: A | Discharge status: Home / Self Care | Payer: No Typology Code available for payment source | Source: Ambulatory Visit | Attending: Internal Medicine | Admitting: Internal Medicine

## 2023-11-14 DIAGNOSIS — M79662 Pain in left lower leg: Secondary | ICD-10-CM

## 2024-01-13 ENCOUNTER — Emergency Department (HOSPITAL_COMMUNITY): Payer: PRIVATE HEALTH INSURANCE

## 2024-01-13 ENCOUNTER — Ambulatory Visit
Admission: EM | Admit: 2024-01-13 | Discharge: 2024-01-13 | Disposition: A | Discharge status: Home / Self Care | Payer: No Typology Code available for payment source

## 2024-01-13 ENCOUNTER — Other Ambulatory Visit: Payer: Self-pay

## 2024-01-13 ENCOUNTER — Emergency Department (HOSPITAL_COMMUNITY)
Admission: EM | Admit: 2024-01-13 | Discharge: 2024-01-13 | Disposition: A | Discharge status: Home / Self Care | Payer: PRIVATE HEALTH INSURANCE

## 2024-01-13 ENCOUNTER — Encounter (HOSPITAL_COMMUNITY): Payer: Self-pay

## 2024-01-13 ENCOUNTER — Encounter: Payer: Self-pay | Admitting: Emergency Medicine

## 2024-01-13 DIAGNOSIS — M79604 Pain in right leg: Secondary | ICD-10-CM | POA: Diagnosis not present

## 2024-01-13 DIAGNOSIS — Z79899 Other long term (current) drug therapy: Secondary | ICD-10-CM | POA: Insufficient documentation

## 2024-01-13 DIAGNOSIS — J189 Pneumonia, unspecified organism: Secondary | ICD-10-CM

## 2024-01-13 DIAGNOSIS — D72829 Elevated white blood cell count, unspecified: Secondary | ICD-10-CM | POA: Insufficient documentation

## 2024-01-13 DIAGNOSIS — R0602 Shortness of breath: Secondary | ICD-10-CM

## 2024-01-13 DIAGNOSIS — M19071 Primary osteoarthritis, right ankle and foot: Secondary | ICD-10-CM | POA: Diagnosis not present

## 2024-01-13 DIAGNOSIS — M79661 Pain in right lower leg: Secondary | ICD-10-CM | POA: Diagnosis not present

## 2024-01-13 DIAGNOSIS — I10 Essential (primary) hypertension: Secondary | ICD-10-CM | POA: Insufficient documentation

## 2024-01-13 DIAGNOSIS — J181 Lobar pneumonia, unspecified organism: Secondary | ICD-10-CM | POA: Diagnosis not present

## 2024-01-13 DIAGNOSIS — M7989 Other specified soft tissue disorders: Secondary | ICD-10-CM

## 2024-01-13 DIAGNOSIS — R7989 Other specified abnormal findings of blood chemistry: Secondary | ICD-10-CM | POA: Insufficient documentation

## 2024-01-13 DIAGNOSIS — Z7982 Long term (current) use of aspirin: Secondary | ICD-10-CM | POA: Insufficient documentation

## 2024-01-13 DIAGNOSIS — I160 Hypertensive urgency: Secondary | ICD-10-CM

## 2024-01-13 DIAGNOSIS — R2243 Localized swelling, mass and lump, lower limb, bilateral: Secondary | ICD-10-CM | POA: Diagnosis present

## 2024-01-13 LAB — CBC WITH DIFFERENTIAL/PLATELET
Abs Immature Granulocytes: 0.05 10*3/uL (ref 0.00–0.07)
Basophils Absolute: 0.1 10*3/uL (ref 0.0–0.1)
Basophils Relative: 1 %
Eosinophils Absolute: 0.2 10*3/uL (ref 0.0–0.5)
Eosinophils Relative: 1 %
HCT: 40.8 % (ref 36.0–46.0)
Hemoglobin: 12.9 g/dL (ref 12.0–15.0)
Immature Granulocytes: 0 %
Lymphocytes Relative: 25 %
Lymphs Abs: 2.9 10*3/uL (ref 0.7–4.0)
MCH: 27 pg (ref 26.0–34.0)
MCHC: 31.6 g/dL (ref 30.0–36.0)
MCV: 85.5 fL (ref 80.0–100.0)
Monocytes Absolute: 0.9 10*3/uL (ref 0.1–1.0)
Monocytes Relative: 7 %
Neutro Abs: 7.6 10*3/uL (ref 1.7–7.7)
Neutrophils Relative %: 66 %
Platelets: 362 10*3/uL (ref 150–400)
RBC: 4.77 MIL/uL (ref 3.87–5.11)
RDW: 13.4 % (ref 11.5–15.5)
WBC: 11.6 10*3/uL — ABNORMAL HIGH (ref 4.0–10.5)
nRBC: 0 % (ref 0.0–0.2)

## 2024-01-13 LAB — RESP PANEL BY RT-PCR (RSV, FLU A&B, COVID)  RVPGX2
Influenza A by PCR: NEGATIVE
Influenza B by PCR: NEGATIVE
Resp Syncytial Virus by PCR: NEGATIVE
SARS Coronavirus 2 by RT PCR: NEGATIVE

## 2024-01-13 LAB — COMPREHENSIVE METABOLIC PANEL
ALT: 12 U/L (ref 0–44)
AST: 17 U/L (ref 15–41)
Albumin: 4.2 g/dL (ref 3.5–5.0)
Alkaline Phosphatase: 94 U/L (ref 38–126)
Anion gap: 13 (ref 5–15)
BUN: 12 mg/dL (ref 6–20)
CO2: 23 mmol/L (ref 22–32)
Calcium: 9.8 mg/dL (ref 8.9–10.3)
Chloride: 102 mmol/L (ref 98–111)
Creatinine, Ser: 0.88 mg/dL (ref 0.44–1.00)
GFR, Estimated: >60 mL/min (ref >60)
Glucose, Bld: 88 mg/dL (ref 70–99)
Potassium: 3.3 mmol/L — ABNORMAL LOW (ref 3.5–5.1)
Sodium: 138 mmol/L (ref 135–145)
Total Bilirubin: 0.5 mg/dL (ref 0.0–1.2)
Total Protein: 8.2 g/dL — ABNORMAL HIGH (ref 6.5–8.1)

## 2024-01-13 LAB — TROPONIN I (HIGH SENSITIVITY)
Troponin I (High Sensitivity): 18 ng/L — ABNORMAL HIGH (ref <18)
Troponin I (High Sensitivity): 18 ng/L — ABNORMAL HIGH (ref <18)

## 2024-01-13 LAB — BRAIN NATRIURETIC PEPTIDE: B Natriuretic Peptide: 48 pg/mL (ref 0.0–100.0)

## 2024-01-13 MED ORDER — IOHEXOL 350 MG/ML SOLN
75.0000 mL | Freq: Once | INTRAVENOUS | Status: AC | PRN
  Administered 2024-01-13: 75 mL via INTRAVENOUS

## 2024-01-13 MED ORDER — DOXYCYCLINE HYCLATE 100 MG PO CAPS: 100.0000 mg | ORAL_CAPSULE | Freq: Two times a day (BID) | ORAL | 0 refills | Status: AC

## 2024-01-13 MED ORDER — DICLOFENAC SODIUM 1 % EX GEL: 2.0000 g | Freq: Four times a day (QID) | CUTANEOUS | 0 refills | Status: AC

## 2024-01-13 MED ORDER — DOXYCYCLINE HYCLATE 100 MG PO TABS
100.0000 mg | ORAL_TABLET | Freq: Once | ORAL | Status: AC
  Administered 2024-01-13: 100 mg via ORAL
  Filled 2024-01-13: qty 1

## 2024-01-13 NOTE — Discharge Instructions (Addendum)
 Please go directly to [facility] Emergency Room for further evaluation and management of the symptoms you are having.

## 2024-01-13 NOTE — ED Provider Notes (Signed)
 [facility]    CSN: [id number] Arrival date & time: 01/13/24  1125      History   Chief Complaint Chief Complaint  Patient presents with   Leg Pain    HPI Jasmine Hill is a 58 y.o. female.   Patient presents today for 8-day history of right foot swelling, right calf swelling, warmth, and redness, and right leg pain.  Reports she has also noticed more shortness of breath with exertion and at rest since the pain began.  She denies current chest pain.  She does feel short of breath.  No recent cough, fever, congestion, or sore throat.  Denies recent fall or trauma to the right leg.  Denies history of blood clots.  Reports she has been taking blood pressure medications as prescribed.  No numbness or tingling in the right foot.  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    Past Medical History:  Diagnosis Date   Acid reflux    Depression    Hypertension    Myocardial infarction [facility])    2007 mild per patient - in [place] last seen by cardiologist- DR Boneta Lucks in  [place]    Sleep apnea    no cpap    There are no active problems to display for this patient.   Past Surgical History:  Procedure Laterality Date   CHOLECYSTECTOMY     FLEXIBLE SIGMOIDOSCOPY N/A 10/14/2016   Procedure: FLEXIBLE SIGMOIDOSCOPY;  Surgeon: Willis Modena, MD;  Location: [facility];  Service: Endoscopy;  Laterality: N/A;    OB History   No obstetric history on file.      Home Medications    Prior to Admission medications   Medication Sig Start Date End Date Taking? Authorizing Provider  Acetaminophen-Caffeine (TENSION HEADACHE PO) Take 1 tablet by mouth daily. Tension headache relief  Acetaminophen 500mg  caffeine 65mg     [provider]  amLODipine (NORVASC) 10 MG tablet Take 1 tablet (10 mg total) by mouth daily. 11/26/15   Tharon Aquas, PA  aspirin EC 81 MG tablet Take 81 mg by mouth  daily.    [provider]  methocarbamol (ROBAXIN) 500 MG tablet Take 1 tablet (500 mg total) by mouth 2 (two) times daily. 09/27/16   Antony Madura, PA-C  naproxen (NAPROSYN) 500 MG tablet Take 1 tablet (500 mg total) by mouth 2 (two) times daily. 09/27/16   Antony Madura, PA-C  olmesartan-hydrochlorothiazide (BENICAR HCT) 40-25 MG tablet Take 1 tablet by mouth daily.    [provider]    Family History History reviewed. No pertinent family history.  Social History Social History   Tobacco Use   Smoking status: Never   Smokeless tobacco: Never  Substance Use Topics   Alcohol use: No   Drug use: No     Allergies   Patient has no known allergies.   Review of Systems Review of Systems Per HPI  Physical Exam Triage Vital Signs ED Triage Vitals  Encounter Vitals Group     BP 01/13/24 1302 (!) 200/92     Systolic BP Percentile --      Diastolic BP Percentile --      Pulse Rate 01/13/24 1302 89     Resp 01/13/24 1302 18     Temp 01/13/24 1302 98.2 F (36.8 C)     Temp Source 01/13/24 1302 Oral     SpO2 01/13/24 1302 98 %  Weight --      Height --      Head Circumference --      Peak Flow --      Pain Score 01/13/24 1304 10     Pain Loc --      Pain Education --      Exclude from Growth Chart --    No data found.  Updated Vital Signs BP (!) 200/92 (BP Location: Right Wrist)   Pulse 89   Temp 98.2 F (36.8 C) (Oral)   Resp 18   SpO2 98%   Visual Acuity Right Eye Distance:   Left Eye Distance:   Bilateral Distance:    Right Eye Near:   Left Eye Near:    Bilateral Near:     Physical Exam Vitals and nursing note reviewed.  Constitutional:      General: She is not in acute distress.    Appearance: Normal appearance. She is not toxic-appearing.  HENT:     Mouth/Throat:     Mouth: Mucous membranes are moist.     Pharynx: Oropharynx is clear.  Cardiovascular:     Rate and Rhythm: Normal rate and regular rhythm.  Pulmonary:      Effort: Pulmonary effort is normal. No respiratory distress.     Breath sounds: Normal breath sounds. No wheezing, rhonchi or rales.  Musculoskeletal:     Comments: Inspection: difficult to determine if there is swelling of RLE due to body habitus; no obvious deformity Palpation: tender to palpation right medial calf; right dorsal foot; no obvious deformities palpated Neurovascular: neurovascularly intact in distal RLE  Skin:    General: Skin is warm and dry.     Capillary Refill: Capillary refill takes less than 2 seconds.     Coloration: Skin is not jaundiced or pale.     Findings: No erythema.  Neurological:     Mental Status: She is alert and oriented to person, place, and time.  Psychiatric:        Behavior: Behavior is cooperative.      UC Treatments / Results  Labs (all labs ordered are listed, but only abnormal results are displayed) Labs Reviewed - No data to display  EKG   Radiology No results found.  Procedures Procedures (including critical care time)  Medications Ordered in UC Medications - No data to display  Initial Impression / Assessment and Plan / UC Course  I have reviewed the triage vital signs and the nursing notes.  Pertinent labs & imaging results that were available during my care of the patient were reviewed by me and considered in my medical decision making (see chart for details).   Patient is hypertensive in triage, otherwise vital signs are stable.  1. Right leg pain 2. Pain and swelling of right lower leg 3. Shortness of breath 4. Hypertensive urgency Given symptoms and exam today, I am concerned for DVT and/or PE among other acute emergent causes I recommended further evaluation and management in ER and transportation by EMS, however patient declines EMS and states she will drive herself to the nearest ER I explained risks and patient verbalized understanding   The patient was given the opportunity to ask questions.  All questions  answered to their satisfaction.  The patient is in agreement to this plan.    Final Clinical Impressions(s) / UC Diagnoses   Final diagnoses:  Right leg pain  Pain and swelling of right lower leg  Shortness of breath  Hypertensive urgency  Discharge Instructions      Please go directly to [facility] for further evaluation and management of the symptoms you are having.    ED Prescriptions   None    PDMP not reviewed this encounter.   Valentino Nose, NP 01/13/24 [phone]

## 2024-01-13 NOTE — ED Provider Notes (Signed)
 [place] EMERGENCY DEPARTMENT AT [facility] Provider Note   CSN: [id number] Arrival date & time: 01/13/24  1344     History  Chief Complaint  Patient presents with   Leg Swelling    Jasmine Hill is a 58 y.o. female.  Patient is a 58 year old female with a past medical history of hypertension, lymphedema who presents emergency department with a chief complaint of increased pain and swelling to her right lower extremity, intermittent chest pain and shortness of breath.  Patient notes that she has been compliant with her blood pressure medications.  She was evaluated in urgent care just prior to arrival and sent to the emergency department for further evaluation.  She notes that her symptoms initially began last week.  She denies any recent falls or blunt chest wall or lower extremity trauma.  She denies any associated numbness or paresthesias.  She notes that the pain is worse along the dorsal aspect of her right foot.  She has had no associated dizziness, lightheadedness or syncope.  She denies any known history of cardiac or pulmonary disease.        Home Medications Prior to Admission medications   Medication Sig Start Date End Date Taking? Authorizing Provider  Acetaminophen-Caffeine (TENSION HEADACHE PO) Take 1 tablet by mouth daily. Tension headache relief  Acetaminophen 500mg  caffeine 65mg     [provider]  amLODipine (NORVASC) 10 MG tablet Take 1 tablet (10 mg total) by mouth daily. 11/26/15   Tharon Aquas, PA  aspirin EC 81 MG tablet Take 81 mg by mouth daily.    [provider]  methocarbamol (ROBAXIN) 500 MG tablet Take 1 tablet (500 mg total) by mouth 2 (two) times daily. 09/27/16   Antony Madura, PA-C  naproxen (NAPROSYN) 500 MG tablet Take 1 tablet (500 mg total) by mouth 2 (two) times daily. 09/27/16   Antony Madura, PA-C  olmesartan-hydrochlorothiazide (BENICAR HCT) 40-25 MG tablet Take 1 tablet by mouth daily.    [provider]      Allergies    Patient has no known allergies.    Review of Systems   Review of Systems  Respiratory:  Positive for shortness of breath.   Cardiovascular:  Positive for chest pain and leg swelling.  All other systems reviewed and are negative.   Physical Exam Updated Vital Signs BP (!) 218/90 (BP Location: Right Arm)   Pulse 88   Temp 98.1 F (36.7 C) (Oral)   Resp 18   Ht 5\' 1"  (1.549 m)   Wt (!) 141 kg   SpO2 100%   BMI 58.73 kg/m  Physical Exam Vitals reviewed.  Constitutional:      Appearance: Normal appearance.  HENT:     Head: Normocephalic and atraumatic.     Nose: Nose normal.     Mouth/Throat:     Mouth: Mucous membranes are moist.  Eyes:     Extraocular Movements: Extraocular movements intact.     Conjunctiva/sclera: Conjunctivae normal.     Pupils: Pupils are equal, round, and reactive to light.  Cardiovascular:     Rate and Rhythm: Normal rate and regular rhythm.     Pulses: Normal pulses.     Heart sounds: Normal heart sounds. No murmur heard.    No gallop.  Pulmonary:     Effort: Pulmonary effort is normal. No respiratory distress.     Breath sounds: Normal breath sounds. No stridor. No wheezing, rhonchi or rales.  Abdominal:  General: Abdomen is flat. Bowel sounds are normal. There is no distension.     Palpations: Abdomen is soft.     Tenderness: There is no abdominal tenderness. There is no guarding.  Musculoskeletal:        General: Normal range of motion.     Cervical back: Normal range of motion and neck supple.     Comments: Swelling noted to bilateral lower extremities, worse on the right, tenderness palpation noted over the dorsal aspect of the right foot, no overlying erythema or warmth, no obvious deformity or bruising, no skin breakdown or ulceration, no lacerations or abrasions, DP and PT pulses are 2+ distally bilateral lower extremities, sensation intact distally  Skin:    General: Skin is warm and dry.  Neurological:      General: No focal deficit present.     Mental Status: She is alert and oriented to person, place, and time. Mental status is at baseline.  Psychiatric:        Mood and Affect: Mood normal.        Behavior: Behavior normal.        Thought Content: Thought content normal.        Judgment: Judgment normal.     ED Results / Procedures / Treatments   Labs (all labs ordered are listed, but only abnormal results are displayed) Labs Reviewed  RESP PANEL BY RT-PCR (RSV, FLU A&B, COVID)  [id number]  COMPREHENSIVE METABOLIC PANEL  CBC WITH DIFFERENTIAL/PLATELET  BRAIN NATRIURETIC PEPTIDE  TROPONIN I (HIGH SENSITIVITY)    EKG None  Radiology No results found.  Procedures Procedures    Medications Ordered in ED Medications - No data to display  ED Course/ Medical Decision Making/ A&P                                 Medical Decision Making Amount and/or Complexity of Data Reviewed Labs: ordered. Radiology: ordered.  Risk Prescription drug management.   This patient presents to the ED for concern of shortness of breath, pain and swelling to right lower extremity differential diagnosis includes DVT, PAD, cellulitis, arthritis, tendinitis, ACS, PE, pneumonia, aortic aneurysm or dissection, peritonitis, myocarditis, endocarditis    Additional history obtained:  Additional history obtained from medical records External records from outside source obtained and reviewed including none   Lab Tests:  I Ordered, and personally interpreted labs.  The pertinent results include: Mildly elevated troponin, leukocytosis   Imaging Studies ordered:  I ordered imaging studies including CTA of chest I independently visualized and interpreted imaging which showed no pulmonary embolus, cardiomegaly, tree-in-bud to bilateral lower extremities I agree with the radiologist interpretation   Medicines ordered and prescription drug management:  I ordered medication including doxycycline,  Voltaren for pneumonia, arthritis Reevaluation of the patient after these medicines showed that the patient improved I have reviewed the patients home medicines and have made adjustments as needed   Problem List / ED Course:  Patient is doing well at this time and is stable for discharge home.  Discussed with patient that she did have a mild elevation in her troponin in the emergency department though it was stable on serial troponins.  EKG had no acute ischemic changes.  Case was discussed with Dr. Bjorn Pippin with cardiology who notes the patient can follow-up on an outpatient basis that she is currently pain-free at this time.  Will cover patient for possible pneumonia at this point given  the tree-in-bud findings on CT of the chest.  Patient has no indication for fluid overload.  She has no signs of pulmonary embolus and no indication for DVT of the lower extremity.  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  Blood pressure has greatly improved in the emergency department without treatment.  Strict return precautions were provided for any new or worsening symptoms.  Patient voiced understanding had no additional questions.   Social Determinants of Health:  None           Final Clinical Impression(s) / ED Diagnoses Final diagnoses:  None    Rx / DC Orders ED Discharge Orders     None         Kathlen Mody 01/13/24 2002    Durwin Glaze, MD 01/14/24 [phone]

## 2024-01-13 NOTE — ED Triage Notes (Signed)
 Pt arrived via POV following recent visit at doctors office. Pt reports needing evalutaion for DVT and PE. Pt presents with right leg swelling and foot swelling. Pt also presents SOB.

## 2024-01-13 NOTE — ED Triage Notes (Signed)
 Pt reports right foot/leg pain since Wednesday. Pt reports pain started out as cramp in right calf. Pt noted to have swollen area to top of right foot and reports "as the pain gets worse I get more short of breath." Denies injury.

## 2024-01-13 NOTE — Discharge Instructions (Addendum)
 Take all antibiotics as directed.  Return to emergency department immediately for any new or worsening symptoms.  Please follow-up closely with cardiology on an outpatient basis for reevaluation and stress testing.

## 2024-01-13 NOTE — ED Notes (Signed)
 Patient is being discharged from the Urgent Care and sent to the Emergency Department via POV. Per Cathlean Marseilles NP, patient is in need of higher level of care due to Right leg pain, swelling, and SOB. Patient is aware and verbalizes understanding of plan of care.  Vitals:   01/13/24 1302  BP: (!) 200/92  Pulse: 89  Resp: 18  Temp: 98.2 F (36.8 C)  SpO2: 98%

## 2024-01-13 NOTE — ED Notes (Signed)
 [place] in room

## 2024-01-21 ENCOUNTER — Encounter: Payer: Self-pay | Admitting: Cardiology

## 2024-01-21 ENCOUNTER — Ambulatory Visit: Payer: PRIVATE HEALTH INSURANCE | Attending: Cardiology | Admitting: Cardiology

## 2024-01-21 VITALS — BP 138/80 | Ht 61.0 in | Wt 323.8 lb

## 2024-01-21 DIAGNOSIS — R0609 Other forms of dyspnea: Secondary | ICD-10-CM | POA: Diagnosis not present

## 2024-01-21 DIAGNOSIS — R0789 Other chest pain: Secondary | ICD-10-CM | POA: Diagnosis not present

## 2024-01-21 DIAGNOSIS — G4730 Sleep apnea, unspecified: Secondary | ICD-10-CM

## 2024-01-21 NOTE — Progress Notes (Signed)
 Clinical Summary Jasmine Hill is a 58 y.o.female seen as a new patient for the following medical problems  1.Elevated troponin/chest pain - ER visit 01/13/24 with leg pain, chest pain, SOB - trop 18-->18. EKG NSR no ischemic changes - bp 218/90 - [place] RLE no DVT. CT PE no PE, mild patchy tree in bud opacities likely infection  - suspect mild trop in setting of severe HTN, also some evidence of pneumonia - on and off chest pains for about 1 month. Dull pain midchest, would occur mostly at rest or with activity. 7-8/10 in severity, some associated nausea, could get sweaty, sometimes headache. Worst with raising right arm. Would roughly about 1 hour. No relation to eating. Better with laying on chest  -  DOE with activities, works Office manager. Noted DOE over the 2-3 months that progressing.  - some LE edema at times.  -CAD risk factores: HTN, prediabetes -2013 stress echo: no ischemia   2. OSA - not using cpap  Past Medical History:  Diagnosis Date   Acid reflux    Depression    Hypertension    Myocardial infarction [facility])    2007 mild per patient - in [place] last seen by cardiologist- DR Boneta Lucks in  [place]    Sleep apnea    no cpap     No Known Allergies   Current Outpatient Medications  Medication Sig Dispense Refill   Acetaminophen-Caffeine (TENSION HEADACHE PO) Take 1 tablet by mouth daily. Tension headache relief  Acetaminophen 500mg  caffeine 65mg      aspirin EC 81 MG tablet Take 81 mg by mouth daily.     diclofenac Sodium (VOLTAREN) 1 % GEL Apply 2 g topically 4 (four) times daily. 100 g 0   doxycycline (VIBRAMYCIN) 100 MG capsule Take 1 capsule (100 mg total) by mouth 2 (two) times daily. 20 capsule 0   olmesartan-hydrochlorothiazide (BENICAR HCT) 40-25 MG tablet Take 1 tablet by mouth daily.     No current facility-administered medications for this visit.     Past Surgical History:  Procedure Laterality Date   CHOLECYSTECTOMY     FLEXIBLE SIGMOIDOSCOPY N/A  10/14/2016   Procedure: FLEXIBLE SIGMOIDOSCOPY;  Surgeon: Willis Modena, MD;  Location: [facility];  Service: Endoscopy;  Laterality: N/A;     No Known Allergies    No family history on file.   Social History Jasmine Hill reports that she has never smoked. She has never used smokeless tobacco. Jasmine Hill reports no history of alcohol use.   Physical Examination Today's Vitals   01/21/24 1116  BP: 138/80  Weight: (!) 323 lb 12.8 oz (146.9 kg)  Height: 5\' 1"  (1.549 m)  PainSc: 10-Worst pain ever  PainLoc: Leg   Body mass index is 61.18 kg/m.   Gen: resting comfortably, no acute distress HEENT: no scleral icterus, pupils equal round and reactive, no palptable cervical adenopathy,  CV: RRR, no m/rg, no jvd Resp: Clear to auscultation bilaterally GI: abdomen is soft, non-tender, non-distended, normal bowel sounds, no hepatosplenomegaly MSK: extremities are warm, no edema.  Skin: warm, no rash Neuro:  no focal deficits Psych: appropriate affect    Assessment and Plan  1.Chest pain - atypical symptoms. Mild trop elevation that was flat during recent ER visit in setting of severe HTN SBP in the 200s and probable pneumonia - no plans for ischemic testing at this time - BMI 61, if considered in the future would not be a good nuclear imaging candidate, would discuss with  our stress PET imagers if could be an option.   2. DOE  Suspect weight related, but with some reported LE edema and long standing untreated sleep apnea will obtain echo  3. OSA - reports abnormal sleep study some time ago, never got cpap - refer to [facility] for evaluation   F/u 3 months      Antoine Poche, M.D.

## 2024-01-21 NOTE — Patient Instructions (Signed)
 Medication Instructions:  Your physician recommends that you continue on your current medications as directed. Please refer to the Current Medication list given to you today.  *If you need a refill on your cardiac medications before your next appointment, please call your pharmacy*   Lab Work: None If you have labs (blood work) drawn today and your tests are completely normal, you will receive your results only by: MyChart Message (if you have MyChart) OR A paper copy in the mail If you have any lab test that is abnormal or we need to change your treatment, we will call you to review the results.   Testing/Procedures: Your physician has requested that you have an echocardiogram. Echocardiography is a painless test that uses sound waves to create images of your heart. It provides your doctor with information about the size and shape of your heart and how well your heart's chambers and valves are working. This procedure takes approximately one hour. There are no restrictions for this procedure. Please do NOT wear cologne, perfume, aftershave, or lotions (deodorant is allowed). Please arrive 15 minutes prior to your appointment time.  Please note: We ask at that you not bring children with you during ultrasound (echo/ vascular) testing. Due to room size and safety concerns, children are not allowed in the ultrasound rooms during exams. Our front office staff cannot provide observation of children in our lobby area while testing is being conducted. An adult accompanying a patient to their appointment will only be allowed in the ultrasound room at the discretion of the ultrasound technician under special circumstances. We apologize for any inconvenience.    Follow-Up: At [facility], you and your health needs are our priority.  As part of our continuing mission to provide you with exceptional heart care, we have created designated Provider Care Teams.  These Care Teams include your  primary Cardiologist (physician) and Advanced Practice Providers (APPs -  Physician Assistants and Nurse Practitioners) who all work together to provide you with the care you need, when you need it.  We recommend signing up for the patient portal called "MyChart".  Sign up information is provided on this After Visit Summary.  MyChart is used to connect with patients for Virtual Visits (Telemedicine).  Patients are able to view lab/test results, encounter notes, upcoming appointments, etc.  Non-urgent messages can be sent to your provider as well.   To learn more about what you can do with MyChart, go to [email or web address].    Your next appointment:   3 month(s)  Provider:   You may see Dina Rich, MD or one of the following Advanced Practice Providers on your designated Care Team:   Randall An, PA-C  Jacolyn Reedy, PA-C     Other Instructions You have been referred to [facility]. They will call you with your first appointment.

## 2024-02-02 ENCOUNTER — Other Ambulatory Visit

## 2024-02-10 ENCOUNTER — Encounter: Payer: Self-pay | Admitting: *Deleted

## 2024-02-22 ENCOUNTER — Ambulatory Visit: Payer: PRIVATE HEALTH INSURANCE | Attending: Cardiology

## 2024-02-22 DIAGNOSIS — R0609 Other forms of dyspnea: Secondary | ICD-10-CM | POA: Diagnosis not present

## 2024-02-23 LAB — ECHOCARDIOGRAM COMPLETE
AR max vel: 2.86 cm2
AV Area VTI: 3.02 cm2
AV Area mean vel: 3.18 cm2
AV Mean grad: 9 mmHg
AV Peak grad: 17.3 mmHg
Ao pk vel: 2.08 m/s
Area-P 1/2: 4.15 cm2
Calc EF: 64 %
MV VTI: 3.66 cm2
S' Lateral: 3.2 cm
Single Plane A2C EF: 62.7 %
Single Plane A4C EF: 63.1 %

## 2024-02-28 ENCOUNTER — Telehealth: Payer: Self-pay | Admitting: Cardiology

## 2024-02-28 NOTE — Telephone Encounter (Signed)
Patient would like a call back to discuss echo results. 

## 2024-03-06 NOTE — Telephone Encounter (Signed)
 Returned call to pt. No answer. Left msg to call back.

## 2024-03-08 NOTE — Telephone Encounter (Signed)
 MyChart msg sent to pt requesting that she returned a call to [facility] for Echo results.

## 2024-03-09 ENCOUNTER — Encounter: Payer: Self-pay | Admitting: *Deleted

## 2024-05-02 ENCOUNTER — Ambulatory Visit: Admitting: Student

## 2024-05-08 ENCOUNTER — Ambulatory Visit: Payer: PRIVATE HEALTH INSURANCE | Admitting: Nurse Practitioner

## 2024-07-11 ENCOUNTER — Ambulatory Visit: Payer: PRIVATE HEALTH INSURANCE | Attending: Nurse Practitioner | Admitting: Nurse Practitioner
